# Patient Record
Sex: Male | Born: 1985 | Race: Black or African American | Hispanic: No | Marital: Single | Smoking: Current every day smoker
Health system: Southern US, Community
[De-identification: ages and names within clinical notes are randomized; demographics above are authoritative.]

---
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
## 2009-01-22 ENCOUNTER — Emergency Department: Payer: Self-pay | Admitting: Emergency Medicine

## 2013-08-17 ENCOUNTER — Emergency Department: Payer: Self-pay | Admitting: Emergency Medicine

## 2013-08-18 LAB — CBC
HCT: 45.6 % (ref 40.0–52.0)
HGB: 15.4 g/dL (ref 13.0–18.0)
MCH: 30.6 pg (ref 26.0–34.0)
MCHC: 33.8 g/dL (ref 32.0–36.0)
MCV: 91 fL (ref 80–100)
Platelet: 259 10*3/uL (ref 150–440)
RBC: 5.03 10*6/uL (ref 4.40–5.90)
RDW: 12.8 % (ref 11.5–14.5)
WBC: 8.2 10*3/uL (ref 3.8–10.6)

## 2013-08-18 LAB — COMPREHENSIVE METABOLIC PANEL
ALT: 25 U/L (ref 12–78)
AST: 16 U/L (ref 15–37)
Albumin: 4.4 g/dL (ref 3.4–5.0)
Alkaline Phosphatase: 77 U/L
Anion Gap: 5 — ABNORMAL LOW (ref 7–16)
BUN: 16 mg/dL (ref 7–18)
Bilirubin,Total: 0.4 mg/dL (ref 0.2–1.0)
CALCIUM: 9.6 mg/dL (ref 8.5–10.1)
CHLORIDE: 104 mmol/L (ref 98–107)
Co2: 30 mmol/L (ref 21–32)
Creatinine: 1.31 mg/dL — ABNORMAL HIGH (ref 0.60–1.30)
EGFR (African American): >60
EGFR (Non-African Amer.): >60
Glucose: 101 mg/dL — ABNORMAL HIGH (ref 65–99)
OSMOLALITY: 279 (ref 275–301)
Potassium: 4.3 mmol/L (ref 3.5–5.1)
Sodium: 139 mmol/L (ref 136–145)
Total Protein: 8.8 g/dL — ABNORMAL HIGH (ref 6.4–8.2)

## 2013-08-18 LAB — URINALYSIS, COMPLETE
BACTERIA: NONE SEEN
BILIRUBIN, UR: NEGATIVE
GLUCOSE, UR: NEGATIVE mg/dL (ref 0–75)
Ketone: NEGATIVE
LEUKOCYTE ESTERASE: NEGATIVE
NITRITE: NEGATIVE
PH: 6 (ref 4.5–8.0)
RBC,UR: 8007 /HPF (ref 0–5)
Specific Gravity: 1.027 (ref 1.003–1.030)
Squamous Epithelial: NONE SEEN

## 2014-07-29 ENCOUNTER — Emergency Department: Payer: Self-pay | Admitting: Emergency Medicine

## 2014-07-29 LAB — URINALYSIS, COMPLETE
BILIRUBIN, UR: NEGATIVE
Bacteria: NONE SEEN
GLUCOSE, UR: NEGATIVE mg/dL (ref 0–75)
Ketone: NEGATIVE
Leukocyte Esterase: NEGATIVE
Nitrite: NEGATIVE
PH: 6 (ref 4.5–8.0)
RBC,UR: 2884 /HPF (ref 0–5)
SQUAMOUS EPITHELIAL: NONE SEEN
Specific Gravity: 1.027 (ref 1.003–1.030)
WBC UR: 3 /HPF (ref 0–5)

## 2014-07-30 LAB — CBC
HCT: 40.9 % (ref 40.0–52.0)
HGB: 13.8 g/dL (ref 13.0–18.0)
MCH: 30.8 pg (ref 26.0–34.0)
MCHC: 33.8 g/dL (ref 32.0–36.0)
MCV: 91 fL (ref 80–100)
Platelet: 203 10*3/uL (ref 150–440)
RBC: 4.48 10*6/uL (ref 4.40–5.90)
RDW: 12.8 % (ref 11.5–14.5)
WBC: 7.8 10*3/uL (ref 3.8–10.6)

## 2014-07-30 LAB — BASIC METABOLIC PANEL
Anion Gap: 4 — ABNORMAL LOW (ref 7–16)
BUN: 18 mg/dL (ref 7–18)
CREATININE: 1.29 mg/dL (ref 0.60–1.30)
Calcium, Total: 8.6 mg/dL (ref 8.5–10.1)
Chloride: 105 mmol/L (ref 98–107)
Co2: 30 mmol/L (ref 21–32)
EGFR (African American): >60
EGFR (Non-African Amer.): >60
Glucose: 100 mg/dL — ABNORMAL HIGH (ref 65–99)
Osmolality: 280 (ref 275–301)
POTASSIUM: 3.5 mmol/L (ref 3.5–5.1)
SODIUM: 139 mmol/L (ref 136–145)

## 2014-09-05 ENCOUNTER — Emergency Department: Payer: Self-pay | Admitting: Emergency Medicine

## 2014-09-05 LAB — URINALYSIS, COMPLETE
BACTERIA: NONE SEEN
BILIRUBIN, UR: NEGATIVE
GLUCOSE, UR: NEGATIVE mg/dL (ref 0–75)
KETONE: NEGATIVE
Leukocyte Esterase: NEGATIVE
NITRITE: NEGATIVE
Ph: 6 (ref 4.5–8.0)
Protein: NEGATIVE
RBC,UR: 65 /HPF (ref 0–5)
SPECIFIC GRAVITY: 1.024 (ref 1.003–1.030)
WBC UR: 2 /HPF (ref 0–5)

## 2014-09-05 LAB — COMPREHENSIVE METABOLIC PANEL
ALK PHOS: 61 U/L (ref 46–116)
Albumin: 3.9 g/dL (ref 3.4–5.0)
Anion Gap: 7 (ref 7–16)
BILIRUBIN TOTAL: 0.5 mg/dL (ref 0.2–1.0)
BUN: 12 mg/dL (ref 7–18)
CO2: 29 mmol/L (ref 21–32)
CREATININE: 1.27 mg/dL (ref 0.60–1.30)
Calcium, Total: 8.9 mg/dL (ref 8.5–10.1)
Chloride: 104 mmol/L (ref 98–107)
EGFR (African American): >60
EGFR (Non-African Amer.): >60
Glucose: 81 mg/dL (ref 65–99)
Osmolality: 278 (ref 275–301)
Potassium: 4 mmol/L (ref 3.5–5.1)
SGOT(AST): 21 U/L (ref 15–37)
SGPT (ALT): 20 U/L (ref 14–63)
Sodium: 140 mmol/L (ref 136–145)
TOTAL PROTEIN: 7.5 g/dL (ref 6.4–8.2)

## 2014-09-05 LAB — CBC WITH DIFFERENTIAL/PLATELET
BASOS ABS: 1 %
COMMENT - H1-COM1: NORMAL
COMMENT - H1-COM2: NORMAL
EOS PCT: 2 %
HCT: 42.3 % (ref 40.0–52.0)
HGB: 14.2 g/dL (ref 13.0–18.0)
Lymphocytes: 62 %
MCH: 30.8 pg (ref 26.0–34.0)
MCHC: 33.6 g/dL (ref 32.0–36.0)
MCV: 92 fL (ref 80–100)
MONOS PCT: 4 %
PLATELETS: 227 10*3/uL (ref 150–440)
RBC: 4.61 10*6/uL (ref 4.40–5.90)
RDW: 13.6 % (ref 11.5–14.5)
Segmented Neutrophils: 29 %
VARIANT LYMPHOCYTE - H1-RLYMPH: 2 %
WBC: 5.2 10*3/uL (ref 3.8–10.6)

## 2015-04-15 ENCOUNTER — Encounter: Payer: Self-pay | Admitting: *Deleted

## 2015-04-15 ENCOUNTER — Emergency Department
Admission: EM | Admit: 2015-04-15 | Discharge: 2015-04-15 | Disposition: A | Discharge status: Home / Self Care | Payer: Self-pay | Attending: Emergency Medicine | Admitting: Emergency Medicine

## 2015-04-15 DIAGNOSIS — J020 Streptococcal pharyngitis: Secondary | ICD-10-CM | POA: Insufficient documentation

## 2015-04-15 MED ORDER — PENICILLIN G BENZATHINE 1200000 UNIT/2ML IM SUSP
2.4000 10*6.[IU] | Freq: Once | INTRAMUSCULAR | Status: AC
  Administered 2015-04-15: 2.4 10*6.[IU] via INTRAMUSCULAR
  Filled 2015-04-15: qty 4

## 2015-04-15 MED ORDER — PREDNISONE 20 MG PO TABS
60.0000 mg | ORAL_TABLET | Freq: Once | ORAL | Status: AC
  Administered 2015-04-15: 60 mg via ORAL
  Filled 2015-04-15: qty 3

## 2015-04-15 NOTE — ED Provider Notes (Signed)
[facility] Emergency Department Provider Note  Time seen: 11:53 AM  I have reviewed the triage vital signs and the nursing notes.   HISTORY  Chief Complaint No chief complaint on file.    HPI Erik Patel is a 29 y.o. male with no past medical history who presents the emergency department with a sore throat 5 days. According to the patient for the past 5 days he has had a progressively worsening sore throat. He states intermittent fevers at home, subjective. Denies nausea or vomiting. Describes the throat pain is moderate, worse with swallowing.   No past medical history on file.  There are no active problems to display for this patient.   No past surgical history on file.  No current outpatient prescriptions on file.  Allergies Review of patient's allergies indicates not on file.  No family history on file.  Social History Social History  Substance Use Topics  . Smoking status: Not on file  . Smokeless tobacco: Not on file  . Alcohol Use: Not on file    Review of Systems Constitutional: Positive subjective fevers at home. ENT: Positive for sore throat. Cardiovascular: Negative for chest pain. Respiratory: Negative for shortness of breath. Gastrointestinal: Negative for abdominal pain Skin: Negative for rash. Neurological: Negative for headache 10-point ROS otherwise negative.  ____________________________________________   PHYSICAL EXAM:  Constitutional: Alert and oriented. Well appearing and in no distress. Eyes: Normal exam ENT   Head: Normocephalic and atraumatic.   Nose: No congestion/rhinnorhea.   Mouth/Throat: Mucous membranes are moist. Moderate pharyngeal erythema with bilateral tonsillar swelling, erythema with exudates. Uvula is midline. Patient does have significant bilateral anterior cervical lymphadenopathy. Patient's floor of the mouth is soft, and nontender. No tenderness to tracheal  rocking. Cardiovascular: Normal rate, regular rhythm. No murmur Respiratory: Normal respiratory effort without tachypnea nor retractions. Breath sounds are clear and equal bilaterally. No wheezes/rales/rhonchi. Gastrointestinal: Soft and nontender. No distention.  Musculoskeletal: Nontender with normal range of motion in all extremities Neurologic:  Normal speech and language. No gross focal neurologic deficits Skin:  Skin is warm, dry and intact.  Psychiatric: Mood and affect are normal. Speech and behavior are normal.   ____________________________________________   INITIAL IMPRESSION / ASSESSMENT AND PLAN / ED COURSE  Pertinent labs & imaging results that were available during my care of the patient were reviewed by me and considered in my medical decision making (see chart for details).  Patient with tonsillar exudates, subjective fever, cervical lymphadenopathy, and lack of cough. We will treat with Bicillin in the emergency department. We will also dose Decadron for symptom relief. I discussed with the patient very close monitoring of his condition, and to return if swelling does not go down, or for any increased swelling of one tonsil, as well as trouble swallowing or breathing. Patient agreeable to plan. At this time no sign of peritonsillar abscess, or retropharyngeal infection..  ____________________________________________   FINAL CLINICAL IMPRESSION(S) / ED DIAGNOSES  Streptococcal pharyngitis   Minna Antis, MD 04/15/15 1157

## 2015-04-15 NOTE — ED Notes (Signed)
Pt here with c/o sore throat x3 days.  

## 2015-04-15 NOTE — Discharge Instructions (Signed)

## 2015-04-18 ENCOUNTER — Emergency Department
Admission: EM | Admit: 2015-04-18 | Discharge: 2015-04-18 | Disposition: A | Discharge status: Home / Self Care | Payer: Self-pay | Attending: Emergency Medicine | Admitting: Emergency Medicine

## 2015-04-18 ENCOUNTER — Encounter: Payer: Self-pay | Admitting: Emergency Medicine

## 2015-04-18 DIAGNOSIS — J029 Acute pharyngitis, unspecified: Secondary | ICD-10-CM | POA: Insufficient documentation

## 2015-04-18 DIAGNOSIS — Z720 Tobacco use: Secondary | ICD-10-CM | POA: Insufficient documentation

## 2015-04-18 DIAGNOSIS — B9789 Other viral agents as the cause of diseases classified elsewhere: Secondary | ICD-10-CM

## 2015-04-18 DIAGNOSIS — J028 Acute pharyngitis due to other specified organisms: Secondary | ICD-10-CM

## 2015-04-18 DIAGNOSIS — B2790 Infectious mononucleosis, unspecified without complication: Secondary | ICD-10-CM | POA: Insufficient documentation

## 2015-04-18 LAB — CBC WITH DIFFERENTIAL/PLATELET
BASOS ABS: 0.2 10*3/uL — AB (ref 0–0.1)
Basophils Relative: 1 %
Eosinophils Absolute: 0 10*3/uL (ref 0–0.7)
Eosinophils Relative: 0 %
HEMATOCRIT: 41.2 % (ref 40.0–52.0)
HEMOGLOBIN: 14 g/dL (ref 13.0–18.0)
LYMPHS PCT: 61 %
Lymphs Abs: 10 10*3/uL — ABNORMAL HIGH (ref 1.0–3.6)
MCH: 30.8 pg (ref 26.0–34.0)
MCHC: 34 g/dL (ref 32.0–36.0)
MCV: 90.8 fL (ref 80.0–100.0)
MONOS PCT: 12 %
Monocytes Absolute: 2 10*3/uL — ABNORMAL HIGH (ref 0.2–1.0)
NEUTROS PCT: 26 %
Neutro Abs: 4.3 10*3/uL (ref 1.4–6.5)
Platelets: 185 10*3/uL (ref 150–440)
RBC: 4.54 MIL/uL (ref 4.40–5.90)
RDW: 13 % (ref 11.5–14.5)
WBC: 16.5 10*3/uL — AB (ref 3.8–10.6)

## 2015-04-18 LAB — MONONUCLEOSIS SCREEN: MONO SCREEN: POSITIVE — AB

## 2015-04-18 LAB — PATHOLOGIST SMEAR REVIEW

## 2015-04-18 MED ORDER — METHYLPREDNISOLONE 4 MG PO TBPK: ORAL_TABLET | ORAL | Status: DC

## 2015-04-18 MED ORDER — LIDOCAINE VISCOUS 2 % MT SOLN
15.0000 mL | Freq: Once | OROMUCOSAL | Status: AC
  Administered 2015-04-18: 15 mL via OROMUCOSAL
  Filled 2015-04-18: qty 15

## 2015-04-18 MED ORDER — DEXAMETHASONE SODIUM PHOSPHATE 10 MG/ML IJ SOLN
10.0000 mg | Freq: Once | INTRAMUSCULAR | Status: AC
  Administered 2015-04-18: 10 mg via INTRAMUSCULAR
  Filled 2015-04-18: qty 1

## 2015-04-18 MED ORDER — TRAMADOL HCL 50 MG PO TABS: 50.0000 mg | ORAL_TABLET | Freq: Two times a day (BID) | ORAL | Status: DC

## 2015-04-18 NOTE — ED Provider Notes (Signed)
[facility] Emergency Department Provider Note ____________________________________________  Time seen: 1155  I have reviewed the triage vital signs and the nursing notes.  HISTORY  Chief Complaint  Sore Throat  HPI Erik Patel is a 29 y.o. male who returns to the ED following a. Treatment for presumed strep tonsillitis 3 days prior. He was treated with Bicillin injection and 60 mg of prednisone. He returns today for continued symptoms including fever, and sore throat. He's been dosing Tylenol intermittently, but reports continued pain and fullness in throat. He also notes a lymph node under his chin.  History reviewed. No pertinent past medical history.  There are no active problems to display for this patient.  History reviewed. No pertinent past surgical history.  Current Outpatient Rx  Name  Route  Sig  Dispense  Refill  . methylPREDNISolone (MEDROL DOSEPAK) 4 MG TBPK tablet      Taper pack as directed.   21 tablet   0   . traMADol (ULTRAM) 50 MG tablet   Oral   Take 1 tablet (50 mg total) by mouth 2 (two) times daily.   10 tablet   0    Allergies Review of patient's allergies indicates no known allergies.  History reviewed. No pertinent family history.  Social History Social History  Substance Use Topics  . Smoking status: Current Every Day Smoker -- 0.50 packs/day    Types: Cigarettes  . Smokeless tobacco: None  . Alcohol Use: No   Review of Systems  Constitutional: Negative for fever. Eyes: Negative for visual changes. ENT: Reports sore throat. Cardiovascular: Negative for chest pain. Respiratory: Negative for shortness of breath. Gastrointestinal: Negative for abdominal pain, vomiting and diarrhea. Genitourinary: Negative for dysuria. Musculoskeletal: Negative for back pain. Skin: Negative for rash. Neurological: Negative for headaches, focal weakness or numbness. ____________________________________________  PHYSICAL  EXAM:  VITAL SIGNS: ED Triage Vitals  Enc Vitals Group     BP 04/18/15 1014 129/86 mmHg     Pulse Rate 04/18/15 1014 93     Resp 04/18/15 1014 17     Temp 04/18/15 1014 100.4 F (38 C)     Temp Source 04/18/15 1014 Oral     SpO2 04/18/15 1014 98 %     Weight 04/18/15 1014 195 lb (88.451 kg)     Height 04/18/15 1014  (1.905 m)     Head Cir --      Peak Flow --      Pain Score 04/18/15 1436 3     Pain Loc --      Pain Edu? --      Excl. in GC? --    Constitutional: Alert and oriented. Well appearing and in no distress. Eyes: Conjunctivae are normal. PERRL. Normal extraocular movements. ENT   Head: Normocephalic and atraumatic.   Nose: No congestion/rhinorrhea.   Mouth/Throat: Mucous membranes are moist. Uvula midline. Tonsils enlarged without erythema.    Neck: Supple. No thyromegaly. Hematological/Lymphatic/Immunological: No anterior cervical and sublingual lymphadenopathy noted.  Cardiovascular: Normal rate, regular rhythm.  Respiratory: Normal respiratory effort. No wheezes/rales/rhonchi. Gastrointestinal: Soft and nontender. No distention. Musculoskeletal: Nontender with normal range of motion in all extremities.  Neurologic:  Normal gait without ataxia. Normal speech and language. No gross focal neurologic deficits are appreciated. Skin:  Skin is warm, dry and intact. No rash noted. Psychiatric: Mood and affect are normal. Patient exhibits appropriate insight and judgment. ____________________________________________    LABS (pertinent positives/negatives) Labs Reviewed  MONONUCLEOSIS SCREEN - Abnormal; Notable  for the following:    Mono Screen POSITIVE (*)    All other components within normal limits  CBC WITH DIFFERENTIAL/PLATELET - Abnormal; Notable for the following:    WBC 16.5 (*)    Lymphs Abs 10.0 (*)    Monocytes Absolute 2.0 (*)    Basophils Absolute 0.2 (*)    All other components within normal limits  CULTURE, GROUP A STREP ([facility] ONLY)   PATHOLOGIST SMEAR REVIEW  ____________________________________________  PROCEDURES  Decadron 10 mg IM Lidocaine 2% gargle ____________________________________________  INITIAL IMPRESSION / ASSESSMENT AND PLAN / ED COURSE  Treatment for presumed pharyngitis due to mononucleosis. Patient discharged with mental prednisone taper pack as well as Ultram as needed for pain. He is to continue Tylenol for fevers and salt water gargles needed for sore throat pain. He will follow-up with his primary care provider or return to the ED as needed for worsening symptoms. ____________________________________________  FINAL CLINICAL IMPRESSION(S) / ED DIAGNOSES  Final diagnoses:  Mononucleosis  Sore throat (viral)     Lissa Hoard, PA-C 04/18/15 1752  Sharman Cheek, MD 04/19/15 0900

## 2015-04-18 NOTE — ED Notes (Signed)
Patient reports being seen here a few days ago for sore throat. Reports being given Penicillin shots but is not feeling any better.

## 2015-04-18 NOTE — Discharge Instructions (Signed)
Infectious Mononucleosis Infectious mononucleosis (mono) is a common germ (viral) infection in children, teenagers, and young adults.  CAUSES  Mono is an infection caused by the Malachi Carl virus. The virus is spread by close personal contact with someone who has the infection. It can be passed by contact with your saliva through things such as kissing or sharing drinking glasses. Sometimes, the infection can be spread from someone who does not appear sick but still spreads the virus (asymptomatic carrier state).  SYMPTOMS  The most common symptoms of Mono are:  Sore throat.  Headache.  Fatigue.  Muscle aches.  Swollen glands.  Fever.  Poor appetite.  Enlarged liver or spleen. The less common symptoms can include:  Rash.  Feeling sick to your stomach (nauseous).  Abdominal pain. DIAGNOSIS  Mono is diagnosed by a blood test.  TREATMENT  Treatment of mono is usually at home. There is no medicine that cures this virus. Sometimes hospital treatment is needed in severe cases. Steroid medicine sometimes is needed if the swelling in the throat causes breathing or swallowing problems.  HOME CARE INSTRUCTIONS   Drink enough fluids to keep your urine clear or pale yellow.  Eat soft foods. Cool foods like popsicles or ice cream can soothe a sore throat.  Only take over-the-counter or prescription medicines for pain, discomfort, or fever as directed by your caregiver. Children under 98 years of age should not take aspirin.  Gargle salt water. This may help relieve your sore throat. Put 1 teaspoon (tsp) of salt in 1 cup of warm water. Sucking on hard candy may also help.  Rest as needed.  Start regular activities gradually after the fever is gone. Be sure to rest when tired.  Avoid strenuous exercise or contact sports until your caregiver says it is okay. The liver and spleen could be seriously injured.  Avoid sharing drinking glasses or kissing until your caregiver tells you  that you are no longer contagious. SEEK MEDICAL CARE IF:   Your fever is not gone after 7 days.  Your activity level is not back to normal after 2 weeks.  You have yellow coloring to eyes and skin (jaundice). SEEK IMMEDIATE MEDICAL CARE IF:   You have severe pain in the abdomen or shoulder.  You have trouble swallowing or drooling.  You have trouble breathing.  You develop a stiff neck.  You develop a severe headache.  You cannot stop throwing up (vomiting).  You have convulsions.  You are confused.  You have trouble with balance.  You develop signs of body fluid loss (dehydration):  Weakness.  Sunken eyes.  Pale skin.  Dry mouth.  Rapid breathing or pulse. MAKE SURE YOU:   Understand these instructions.  Will watch your condition.  Will get help right away if you are not doing well or get worse. Document Released: 07/24/2000 Document Revised: 10/19/2011 Document Reviewed: 05/22/2008 [facility] Patient Information 2015 [place], [place]. This information is not intended to replace advice given to you by your health care provider. Make sure you discuss any questions you have with your health care provider.  Pharyngitis Pharyngitis is a sore throat (pharynx). There is redness, pain, and swelling of your throat. HOME CARE   Drink enough fluids to keep your pee (urine) clear or pale yellow.  Only take medicine as told by your doctor.  You may get sick again if you do not take medicine as told. Finish your medicines, even if you start to feel better.  Do not take  aspirin.  Rest.  Rinse your mouth (gargle) with salt water ( tsp of salt per 1 qt of water) every 1-2 hours. This will help the pain.  If you are not at risk for choking, you can suck on hard candy or sore throat lozenges. GET HELP IF:  You have large, tender lumps on your neck.  You have a rash.  You cough up green, yellow-brown, or bloody spit. GET HELP RIGHT AWAY IF:   You have a stiff  neck.  You drool or cannot swallow liquids.  You throw up (vomit) or are not able to keep medicine or liquids down.  You have very bad pain that does not go away with medicine.  You have problems breathing (not from a stuffy nose). MAKE SURE YOU:   Understand these instructions.  Will watch your condition.  Will get help right away if you are not doing well or get worse. Document Released: 01/13/2008 Document Revised: 05/17/2013 Document Reviewed: 04/03/2013 [facility] Patient Information 2015 [place], [place]. This information is not intended to replace advice given to you by your health care provider. Make sure you discuss any questions you have with your health care provider.   Take the steroid as directed. Rinse with warm salty water daily.  Take only Tylenol in addition to the steroid and pain medicines. Follow-up with [employer] as needed.

## 2015-04-21 LAB — CULTURE, GROUP A STREP (THRC)

## 2015-04-22 NOTE — Progress Notes (Signed)
Recommended no antibiotic treatment for throat culture growing Group B Strep in patient d/c from ED with mono. Dr. Lacretia Nicks signed off on recommendation.

## 2017-09-03 ENCOUNTER — Encounter: Payer: Self-pay | Admitting: *Deleted

## 2017-09-03 ENCOUNTER — Ambulatory Visit
Admission: EM | Admit: 2017-09-03 | Discharge: 2017-09-03 | Disposition: A | Discharge status: Home / Self Care | Payer: Self-pay | Attending: Family Medicine | Admitting: Family Medicine

## 2017-09-03 ENCOUNTER — Other Ambulatory Visit: Payer: Self-pay

## 2017-09-03 DIAGNOSIS — H6502 Acute serous otitis media, left ear: Secondary | ICD-10-CM | POA: Insufficient documentation

## 2017-09-03 DIAGNOSIS — F17210 Nicotine dependence, cigarettes, uncomplicated: Secondary | ICD-10-CM | POA: Insufficient documentation

## 2017-09-03 DIAGNOSIS — J069 Acute upper respiratory infection, unspecified: Secondary | ICD-10-CM | POA: Insufficient documentation

## 2017-09-03 DIAGNOSIS — J029 Acute pharyngitis, unspecified: Secondary | ICD-10-CM

## 2017-09-03 DIAGNOSIS — B9789 Other viral agents as the cause of diseases classified elsewhere: Secondary | ICD-10-CM

## 2017-09-03 DIAGNOSIS — R05 Cough: Secondary | ICD-10-CM

## 2017-09-03 LAB — RAPID STREP SCREEN (MED CTR MEBANE ONLY): STREPTOCOCCUS, GROUP A SCREEN (DIRECT): NEGATIVE

## 2017-09-03 MED ORDER — AMOXICILLIN 875 MG PO TABS: 875.0000 mg | ORAL_TABLET | Freq: Two times a day (BID) | ORAL | 0 refills | Status: DC

## 2017-09-03 NOTE — ED Triage Notes (Signed)
Patient started having symptoms of sore throat, nasal congestion, and bilateral ear pain 2 days ago.

## 2017-09-03 NOTE — ED Provider Notes (Signed)
[facility]    CSN: [id number] Arrival date & time: 09/03/17  1151     History   Chief Complaint Chief Complaint  Patient presents with  . Sore Throat  . Otalgia    HPI Erik Planerrance J Patel is a 32 y.o. male.   The history is provided by the patient.  Sore Throat   Otalgia  Associated symptoms: congestion, cough, rhinorrhea and sore throat   URI  Presenting symptoms: congestion, cough, ear pain, fatigue, rhinorrhea and sore throat   Severity:  Moderate Onset quality:  Sudden Duration:  3 days Timing:  Constant Progression:  Worsening Chronicity:  New Relieved by:  Nothing Ineffective treatments:  OTC medications Associated symptoms: no sinus pain and no wheezing   Risk factors: sick contacts   Risk factors: not elderly, no chronic cardiac disease, no chronic kidney disease, no chronic respiratory disease, no diabetes mellitus, no immunosuppression, no recent illness and no recent travel     History reviewed. No pertinent past medical history.  There are no active problems to display for this patient.   History reviewed. No pertinent surgical history.     Home Medications    Prior to Admission medications   Medication Sig Start Date End Date Taking? Authorizing Provider  amoxicillin (AMOXIL) 875 MG tablet Take 1 tablet (875 mg total) by mouth 2 (two) times daily. 09/03/17   Payton Mccallumonty, Greco Gastelum, MD  methylPREDNISolone (MEDROL DOSEPAK) 4 MG TBPK tablet Taper pack as directed. 04/18/15   Menshew, Charlesetta IvoryJenise V Bacon, PA-C  traMADol (ULTRAM) 50 MG tablet Take 1 tablet (50 mg total) by mouth 2 (two) times daily. 04/18/15   Menshew, Charlesetta IvoryJenise V Bacon, PA-C    Family History Family History  Problem Relation Age of Onset  . Diabetes Mother     Social History Social History   Tobacco Use  . Smoking status: Current Every Day Smoker    Packs/day: 0.50    Types: Cigarettes  . Smokeless tobacco: Never Used  Substance Use Topics  . Alcohol use: No  . Drug use: No      Allergies   Patient has no known allergies.   Review of Systems Review of Systems  Constitutional: Positive for fatigue.  HENT: Positive for congestion, ear pain, rhinorrhea and sore throat. Negative for sinus pain.   Respiratory: Positive for cough. Negative for wheezing.      Physical Exam Triage Vital Signs ED Triage Vitals  Enc Vitals Group     BP 09/03/17 1202 113/74     Pulse Rate 09/03/17 1202 71     Resp 09/03/17 1202 16     Temp 09/03/17 1202 97.7 F (36.5 C)     Temp Source 09/03/17 1202 Oral     SpO2 09/03/17 1202 100 %     Weight 09/03/17 1204 210 lb (95.3 kg)     Height 09/03/17 1204 6\' 3"  (1.905 m)     Head Circumference --      Peak Flow --      Pain Score 09/03/17 1203 8     Pain Loc --      Pain Edu? --      Excl. in GC? --    No data found.  Updated Vital Signs BP 113/74 (BP Location: Left Arm)   Pulse 71   Temp 97.7 F (36.5 C) (Oral)   Resp 16   Ht 6\' 3"  (1.905 m)   Wt 210 lb (95.3 kg)   SpO2 100%   BMI 26.25  kg/m   Visual Acuity Right Eye Distance:   Left Eye Distance:   Bilateral Distance:    Right Eye Near:   Left Eye Near:    Bilateral Near:     Physical Exam  Constitutional: He appears well-developed and well-nourished. No distress.  HENT:  Head: Normocephalic and atraumatic.  Right Ear: Tympanic membrane, external ear and ear canal normal.  Left Ear: External ear and ear canal normal. Tympanic membrane is erythematous and bulging. A middle ear effusion is present.  Nose: Nose normal.  Mouth/Throat: Uvula is midline and mucous membranes are normal. Posterior oropharyngeal erythema present. No oropharyngeal exudate, posterior oropharyngeal edema or tonsillar abscesses. No tonsillar exudate.  Eyes: Conjunctivae and EOM are normal. Pupils are equal, round, and reactive to light. Right eye exhibits no discharge. Left eye exhibits no discharge. No scleral icterus.  Neck: Normal range of motion. Neck supple. No tracheal  deviation present. No thyromegaly present.  Cardiovascular: Normal rate, regular rhythm and normal heart sounds.  Pulmonary/Chest: Effort normal and breath sounds normal. No stridor. No respiratory distress. He has no wheezes. He has no rales. He exhibits no tenderness.  Lymphadenopathy:    He has no cervical adenopathy.  Neurological: He is alert.  Skin: Skin is warm and dry. No rash noted. He is not diaphoretic.  Nursing note and vitals reviewed.    UC Treatments / Results  Labs (all labs ordered are listed, but only abnormal results are displayed) Labs Reviewed  RAPID STREP SCREEN (NOT AT [facility])  CULTURE, GROUP A STREP [facility])    EKG  EKG Interpretation None       Radiology No results found.  Procedures Procedures (including critical care time)  Medications Ordered in UC Medications - No data to display   Initial Impression / Assessment and Plan / UC Course  I have reviewed the triage vital signs and the nursing notes.  Pertinent labs & imaging results that were available during my care of the patient were reviewed by me and considered in my medical decision making (see chart for details).       Final Clinical Impressions(s) / UC Diagnoses   Final diagnoses:  Viral URI  Acute serous otitis media of left ear, recurrence not specified    ED Discharge Orders        Ordered    amoxicillin (AMOXIL) 875 MG tablet  2 times daily     09/03/17 1250     1. Lab results and diagnosis reviewed with patient 2. rx as per orders above; reviewed possible side effects, interactions, risks and benefits  3. Recommend supportive treatment with  4. Follow-up prn if symptoms worsen or don't improve  Controlled Substance Prescriptions [place] Controlled Substance Registry consulted? Not Applicable   Payton Mccallum, MD 09/03/17 1258

## 2017-09-06 LAB — CULTURE, GROUP A STREP (THRC)

## 2018-09-03 ENCOUNTER — Encounter: Payer: Self-pay | Admitting: Emergency Medicine

## 2018-09-03 ENCOUNTER — Emergency Department
Admission: EM | Admit: 2018-09-03 | Discharge: 2018-09-03 | Disposition: A | Discharge status: Home / Self Care | Payer: Managed Care, Other (non HMO) | Attending: Emergency Medicine | Admitting: Emergency Medicine

## 2018-09-03 ENCOUNTER — Other Ambulatory Visit: Payer: Self-pay

## 2018-09-03 DIAGNOSIS — Z79899 Other long term (current) drug therapy: Secondary | ICD-10-CM | POA: Diagnosis not present

## 2018-09-03 DIAGNOSIS — F17210 Nicotine dependence, cigarettes, uncomplicated: Secondary | ICD-10-CM | POA: Diagnosis not present

## 2018-09-03 DIAGNOSIS — B009 Herpesviral infection, unspecified: Secondary | ICD-10-CM | POA: Insufficient documentation

## 2018-09-03 DIAGNOSIS — N4889 Other specified disorders of penis: Secondary | ICD-10-CM | POA: Diagnosis present

## 2018-09-03 MED ORDER — NEOMYCIN-POLYMYXIN-PRAMOXINE 1 % EX CREA: TOPICAL_CREAM | Freq: Two times a day (BID) | CUTANEOUS | 0 refills | Status: DC

## 2018-09-03 MED ORDER — ACYCLOVIR 400 MG PO TABS: 400.0000 mg | ORAL_TABLET | Freq: Every day | ORAL | 0 refills | Status: DC

## 2018-09-03 NOTE — Discharge Instructions (Signed)
[place]pson salt soaks as directed.  Have sexual partner evaluated.

## 2018-09-03 NOTE — ED Notes (Signed)
Pt given follow up instructions at [facility] Department for further STD testing and for partner to be tested. Informed pt that these are free services that he can take advantage of. Pt verbalized understanding.

## 2018-09-03 NOTE — ED Provider Notes (Signed)
[facility] Emergency Department Provider Note   ____________________________________________   First MD Initiated Contact with Patient 09/03/18 1206     (approximate)  I have reviewed the triage vital signs and the nursing notes.   HISTORY  Chief Complaint Blister    HPI Fuller Planerrance J Argo is a 33 y.o. male patient presents with blisters on his penis for 1 week.  Patient denies urethral discharge.   Patient state burning itching sensation with lesions.  Patient denies inguinal swelling or fever.  Patient stated in a monogamous relationship.  No palliative measure for complaint.   History reviewed. No pertinent past medical history.  There are no active problems to display for this patient.   History reviewed. No pertinent surgical history.  Prior to Admission medications   Medication Sig Start Date End Date Taking? Authorizing Provider  acyclovir (ZOVIRAX) 400 MG tablet Take 1 tablet (400 mg total) by mouth 5 (five) times daily. 09/03/18   Joni ReiningSmith, Greysen Devino K, PA-C  amoxicillin (AMOXIL) 875 MG tablet Take 1 tablet (875 mg total) by mouth 2 (two) times daily. 09/03/17   Payton Mccallumonty, [place], MD  methylPREDNISolone (MEDROL DOSEPAK) 4 MG TBPK tablet Taper pack as directed. 04/18/15   Menshew, Charlesetta IvoryJenise V Bacon, PA-C  neomycin-polymyxin-pramoxine (NEOSPORIN PLUS) 1 % cream Apply topically 2 (two) times daily. 09/03/18   Joni ReiningSmith, Evellyn Tuff K, PA-C  traMADol (ULTRAM) 50 MG tablet Take 1 tablet (50 mg total) by mouth 2 (two) times daily. 04/18/15   Menshew, Charlesetta IvoryJenise V Bacon, PA-C    Allergies Patient has no known allergies.  Family History  Problem Relation Age of Onset  . Diabetes Mother     Social History Social History   Tobacco Use  . Smoking status: Current Every Day Smoker    Packs/day: 0.50    Types: Cigarettes  . Smokeless tobacco: Never Used  Substance Use Topics  . Alcohol use: No  . Drug use: No    Review of Systems Constitutional: No fever/chills Eyes:  No visual changes. ENT: No sore throat. Cardiovascular: Denies chest pain. Respiratory: Denies shortness of breath. Gastrointestinal: No abdominal pain.  No nausea, no vomiting.  No diarrhea.  No constipation. Genitourinary: Negative for dysuria. Musculoskeletal: Negative for back pain. Skin: Positive for rash. Neurological: Negative for headaches, focal weakness or numbness.   ____________________________________________   PHYSICAL EXAM:  VITAL SIGNS: ED Triage Vitals  Enc Vitals Group     BP 09/03/18 1204 (!) 124/52     Pulse Rate 09/03/18 1204 65     Resp 09/03/18 1204 18     Temp 09/03/18 1204 97.8 F (36.6 C)     Temp Source 09/03/18 1204 Oral     SpO2 09/03/18 1204 100 %     Weight 09/03/18 1205 200 lb (90.7 kg)     Height 09/03/18 1205 6\' 3"  (1.905 m)     Head Circumference --      Peak Flow --      Pain Score 09/03/18 1205 8     Pain Loc --      Pain Edu? --      Excl. in [place]? --     Constitutional: Alert and oriented. Well appearing and in no acute distress. Cardiovascular: Normal rate, regular rhythm. Grossly normal heart sounds.  Good peripheral circulation. Respiratory: Normal respiratory effort.  No retractions. Lungs CTAB. Genitourinary: No urethral discharge. Musculoskeletal: No lower extremity tenderness nor edema.  No joint effusions. Neurologic:  Normal speech and language. No gross focal  neurologic deficits are appreciated. No gait instability. Skin:  Skin is warm, dry and intact. No rash noted. Psychiatric: Mood and affect are normal. Speech and behavior are normal.  ____________________________________________   LABS (all labs ordered are listed, but only abnormal results are displayed)  Labs Reviewed - No data to display ____________________________________________  EKG   ____________________________________________  RADIOLOGY  ED MD interpretation:    Official radiology report(s): No results  found.  ____________________________________________   PROCEDURES  Procedure(s) performed: None  Procedures  Critical Care performed: No  ____________________________________________   INITIAL IMPRESSION / ASSESSMENT AND PLAN / ED COURSE  As part of my medical decision making, I reviewed the following data within the electronic MEDICAL RECORD NUMBER     Patient presents with vascular lesions on shaft of penis consistent with herpes simplex 2.  Patient given discharge care instruction advised follow-up with health department for further evaluation and treatment as needed.  Patient given a prescription for acyclovir.      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    Joni Reining, PA-C 09/03/18 1220    Jene Every, MD 09/03/18 [phone]

## 2018-09-03 NOTE — ED Triage Notes (Signed)
States blister penis x 1 week. Denies discharge.

## 2019-05-24 ENCOUNTER — Other Ambulatory Visit: Payer: Self-pay

## 2019-05-24 DIAGNOSIS — Z20822 Contact with and (suspected) exposure to covid-19: Secondary | ICD-10-CM

## 2019-05-25 LAB — NOVEL CORONAVIRUS, NAA: SARS-CoV-2, NAA: NOT DETECTED

## 2019-09-04 ENCOUNTER — Emergency Department: Payer: Self-pay

## 2019-09-04 ENCOUNTER — Emergency Department
Admission: EM | Admit: 2019-09-04 | Discharge: 2019-09-04 | Disposition: A | Discharge status: Home / Self Care | Payer: Self-pay | Attending: Emergency Medicine | Admitting: Emergency Medicine

## 2019-09-04 ENCOUNTER — Encounter: Payer: Self-pay | Admitting: Emergency Medicine

## 2019-09-04 ENCOUNTER — Other Ambulatory Visit: Payer: Self-pay

## 2019-09-04 DIAGNOSIS — K529 Noninfective gastroenteritis and colitis, unspecified: Secondary | ICD-10-CM | POA: Insufficient documentation

## 2019-09-04 DIAGNOSIS — R112 Nausea with vomiting, unspecified: Secondary | ICD-10-CM | POA: Insufficient documentation

## 2019-09-04 DIAGNOSIS — F17210 Nicotine dependence, cigarettes, uncomplicated: Secondary | ICD-10-CM | POA: Insufficient documentation

## 2019-09-04 DIAGNOSIS — Z20822 Contact with and (suspected) exposure to covid-19: Secondary | ICD-10-CM | POA: Insufficient documentation

## 2019-09-04 LAB — CBC
HCT: 42.9 % (ref 39.0–52.0)
Hemoglobin: 14.8 g/dL (ref 13.0–17.0)
MCH: 31.2 pg (ref 26.0–34.0)
MCHC: 34.5 g/dL (ref 30.0–36.0)
MCV: 90.5 fL (ref 80.0–100.0)
Platelets: 205 10*3/uL (ref 150–400)
RBC: 4.74 MIL/uL (ref 4.22–5.81)
RDW: 12.4 % (ref 11.5–15.5)
WBC: 7.6 10*3/uL (ref 4.0–10.5)
nRBC: 0 % (ref 0.0–0.2)

## 2019-09-04 LAB — URINALYSIS, COMPLETE (UACMP) WITH MICROSCOPIC
Bilirubin Urine: NEGATIVE
Glucose, UA: NEGATIVE mg/dL
Hgb urine dipstick: NEGATIVE
Ketones, ur: 20 mg/dL — AB
Nitrite: NEGATIVE
Protein, ur: 30 mg/dL — AB
Specific Gravity, Urine: 1.026 (ref 1.005–1.030)
pH: 7 (ref 5.0–8.0)

## 2019-09-04 LAB — COMPREHENSIVE METABOLIC PANEL
ALT: 16 U/L (ref 0–44)
AST: 23 U/L (ref 15–41)
Albumin: 4.5 g/dL (ref 3.5–5.0)
Alkaline Phosphatase: 58 U/L (ref 38–126)
Anion gap: 7 (ref 5–15)
BUN: 14 mg/dL (ref 6–20)
CO2: 28 mmol/L (ref 22–32)
Calcium: 9.4 mg/dL (ref 8.9–10.3)
Chloride: 104 mmol/L (ref 98–111)
Creatinine, Ser: 1.18 mg/dL (ref 0.61–1.24)
GFR calc Af Amer: >60 mL/min (ref >60)
GFR calc non Af Amer: >60 mL/min (ref >60)
Glucose, Bld: 106 mg/dL — ABNORMAL HIGH (ref 70–99)
Potassium: 4.1 mmol/L (ref 3.5–5.1)
Sodium: 139 mmol/L (ref 135–145)
Total Bilirubin: 0.6 mg/dL (ref 0.3–1.2)
Total Protein: 7.7 g/dL (ref 6.5–8.1)

## 2019-09-04 LAB — LIPASE, BLOOD: Lipase: 25 U/L (ref 11–51)

## 2019-09-04 LAB — SARS CORONAVIRUS 2 (TAT 6-24 HRS): SARS Coronavirus 2: NEGATIVE

## 2019-09-04 MED ORDER — SODIUM CHLORIDE 0.9% FLUSH: 3.0000 mL | Freq: Once | INTRAVENOUS | Status: DC

## 2019-09-04 MED ORDER — IOHEXOL 300 MG/ML  SOLN
100.0000 mL | Freq: Once | INTRAMUSCULAR | Status: AC | PRN
  Administered 2019-09-04: 100 mL via INTRAVENOUS
  Filled 2019-09-04: qty 100

## 2019-09-04 MED ORDER — SODIUM CHLORIDE 0.9 % IV BOLUS
1000.0000 mL | Freq: Once | INTRAVENOUS | Status: AC
  Administered 2019-09-04: 1000 mL via INTRAVENOUS

## 2019-09-04 MED ORDER — MORPHINE SULFATE (PF) 4 MG/ML IV SOLN
4.0000 mg | Freq: Once | INTRAVENOUS | Status: AC
  Administered 2019-09-04: 4 mg via INTRAVENOUS
  Filled 2019-09-04: qty 1

## 2019-09-04 MED ORDER — ONDANSETRON HCL 4 MG/2ML IJ SOLN
4.0000 mg | Freq: Once | INTRAMUSCULAR | Status: AC
  Administered 2019-09-04: 11:00:00 4 mg via INTRAVENOUS
  Filled 2019-09-04: qty 2

## 2019-09-04 NOTE — ED Notes (Signed)
See triage note  Presents with mid abd pain states pain started this am    Also has had some urinary freq and pain  Afebrile on arrival

## 2019-09-04 NOTE — ED Triage Notes (Signed)
Pt presents to ED via POV with c/o lower abdominal pain that started last night. Pt c/o burning with urination, difficulty urinating, and chills that started last night. Pt ambulatory without difficulty in the lobby at this time, A&O x 4.

## 2019-09-04 NOTE — ED Provider Notes (Signed)
[facility] Emergency Department Provider Note  ____________________________________________   First MD Initiated Contact with Patient 09/04/19 1018     (approximate)  I have reviewed the triage vital signs and the nursing notes.   HISTORY  Chief Complaint Abdominal Pain    HPI NORRIS BRUMBACH is a 34 y.o. male presents emergency department complaining of abdominal pain with bloody diarrhea.  States he has burning when he poops.  No difficulty with urination.  States he is not burning with urination.  No fever or chills that he knows of.  No known exposure to Covid.   Some nausea and one episode of vomiting.  Multiple episodes of diarrhea.  He states some afraid if something I ate.   History reviewed. No pertinent past medical history.  There are no problems to display for this patient.   History reviewed. No pertinent surgical history.  Prior to Admission medications   Not on File    Allergies Patient has no known allergies.  Family History  Problem Relation Age of Onset  . Diabetes Mother     Social History Social History   Tobacco Use  . Smoking status: Current Every Day Smoker    Packs/day: 0.50    Types: Cigarettes  . Smokeless tobacco: Never Used  Substance Use Topics  . Alcohol use: No  . Drug use: No    Review of Systems  Constitutional: No fever/chills Eyes: No visual changes. ENT: No sore throat. Respiratory: Denies cough Cardiovascular: Denies chest pain Gastrointestinal: Positive abdominal pain Genitourinary: Negative for dysuria. Musculoskeletal: Negative for back pain. Skin: Negative for rash. Psychiatric: no mood changes,     ____________________________________________   PHYSICAL EXAM:  VITAL SIGNS: ED Triage Vitals  Enc Vitals Group     BP 09/04/19 0922 134/66     Pulse Rate 09/04/19 0922 66     Resp 09/04/19 0922 18     Temp 09/04/19 0922 97.9 F (36.6 C)     Temp Source 09/04/19 0922 Oral       SpO2 09/04/19 0922 96 %     Weight 09/04/19 0918 200 lb (90.7 kg)     Height 09/04/19 0918 6\' 3"  (1.905 m)     Head Circumference --      Peak Flow --      Pain Score 09/04/19 0918 10     Pain Loc --      Pain Edu? --      Excl. in [place]? --     Constitutional: Alert and oriented. Well appearing and in no acute distress. Eyes: Conjunctivae are normal.  Head: Atraumatic. Nose: No congestion/rhinnorhea. Mouth/Throat: Mucous membranes are moist.   Neck:  supple no lymphadenopathy noted Cardiovascular: Normal rate, regular rhythm. Heart sounds are normal Respiratory: Normal respiratory effort.  No retractions, lungs c t a  Abd: soft tender in the left lower quadrant, bs normal all 4 quad GU: deferred Musculoskeletal: FROM all extremities, warm and well perfused Neurologic:  Normal speech and language.  Skin:  Skin is warm, dry and intact. No rash noted. Psychiatric: Mood and affect are normal. Speech and behavior are normal.  ____________________________________________   LABS (all labs ordered are listed, but only abnormal results are displayed)  Labs Reviewed  COMPREHENSIVE METABOLIC PANEL - Abnormal; Notable for the following components:      Result Value   Glucose, Bld 106 (*)    All other components within normal limits  URINALYSIS, COMPLETE (UACMP) WITH MICROSCOPIC - Abnormal; Notable  for the following components:   Color, Urine YELLOW (*)    APPearance CLOUDY (*)    Ketones, ur 20 (*)    Protein, ur 30 (*)    Leukocytes,Ua TRACE (*)    Bacteria, UA RARE (*)    All other components within normal limits  SARS CORONAVIRUS 2 (TAT 6-24 HRS)  LIPASE, BLOOD  CBC   ____________________________________________   ____________________________________________  RADIOLOGY  CT abdomen/pelvis with IV contrast is negative, normal appendix  ____________________________________________   PROCEDURES  Procedure(s) performed: Saline lock, normal saline 1 L IV, morphine 4  mg IV, Zofran 4 mg IV   Procedures    ____________________________________________   INITIAL IMPRESSION / ASSESSMENT AND PLAN / ED COURSE  Pertinent labs & imaging results that were available during my care of the patient were reviewed by me and considered in my medical decision making (see chart for details).   Patient is 34 year old male presents emergency department with abdominal pain and diarrhea.  Physical exam shows patient to appear stable.  Vitals normal.  Left lower quadrant is tender to palpation.  DDx: Gastroenteritis, diverticulitis, Covid, rectal bleeding  Urinalysis has 20 ketones, 30 protein and trace of leuks, WBCs are 21-50, comprehensive metabolic panel with glucose 106, lipase is normal and CBC is normal  CT abdomen/pelvis with IV contrast  Patient was given saline lock, normal saline 1 L IV, Zofran 4 mg IV, morphine 4 mg.   CT abdomen/pelvis IV contrast is negative.  Explained findings to patient.  Tell him that we have seen some Covid-like symptoms with the GI problems.  We will do a Covid test prior to discharge.  Explained to him he should stay home quarantine until Covid test have been resulted.  He may return to work if the Covid test is negative, his symptoms have improved, he has not had fever in 24 to 48 hours.  However if he is positive he should quarantine for additional 10 days.  Drink plenty fluids.  Take over-the-counter Imodium A-D.  Return as needed.  Follow-up with GI if he continues to have any rectal bleeding.  He was discharged in stable condition   ARWIN BISCEGLIA was evaluated in Emergency Department on 09/04/2019 for the symptoms described in the history of present illness. He was evaluated in the context of the global COVID-19 pandemic, which necessitated consideration that the patient might be at risk for infection with the SARS-CoV-2 virus that causes COVID-19. Institutional protocols and algorithms that pertain to the evaluation of patients  at risk for COVID-19 are in a state of rapid change based on information released by regulatory bodies including the CDC and federal and state organizations. These policies and algorithms were followed during the patient's care in the ED.   As part of my medical decision making, I reviewed the following data within the electronic MEDICAL RECORD NUMBER Nursing notes reviewed and incorporated, Labs reviewed see above, Old chart reviewed, Radiograph reviewed see above, Notes from prior ED visits and [place] Controlled Substance Database  ____________________________________________   FINAL CLINICAL IMPRESSION(S) / ED DIAGNOSES  Final diagnoses:  Gastroenteritis  Suspected COVID-19 virus infection      NEW MEDICATIONS STARTED DURING THIS VISIT:  New Prescriptions   No medications on file     Note:  This document was prepared using Dragon voice recognition software and may include unintentional dictation errors.    Faythe Ghee, PA-C 09/04/19 1204    Sharyn Creamer, MD 09/10/19 1250

## 2019-09-04 NOTE — Discharge Instructions (Signed)
Follow-up with your regular doctor if not improving in 3 days.  Take over-the-counter Imodium A-D to help decrease the amount of diarrhea.  Drink plenty of water as she were a little dehydrated upon arrival.  Return emergency department if you are worsening.  If the bloody stools do not stop after this illness you will need to be seen by GI.  Please call them for an appointment

## 2019-12-16 ENCOUNTER — Other Ambulatory Visit: Payer: Self-pay

## 2019-12-16 ENCOUNTER — Emergency Department

## 2019-12-16 ENCOUNTER — Emergency Department
Admission: EM | Admit: 2019-12-16 | Discharge: 2019-12-16 | Disposition: A | Discharge status: Home / Self Care | Attending: Student | Admitting: Student

## 2019-12-16 DIAGNOSIS — Y929 Unspecified place or not applicable: Secondary | ICD-10-CM | POA: Diagnosis not present

## 2019-12-16 DIAGNOSIS — Z23 Encounter for immunization: Secondary | ICD-10-CM | POA: Insufficient documentation

## 2019-12-16 DIAGNOSIS — Y999 Unspecified external cause status: Secondary | ICD-10-CM | POA: Insufficient documentation

## 2019-12-16 DIAGNOSIS — Y9302 Activity, running: Secondary | ICD-10-CM | POA: Insufficient documentation

## 2019-12-16 DIAGNOSIS — Y9339 Activity, other involving climbing, rappelling and jumping off: Secondary | ICD-10-CM | POA: Insufficient documentation

## 2019-12-16 DIAGNOSIS — W19XXXA Unspecified fall, initial encounter: Secondary | ICD-10-CM

## 2019-12-16 DIAGNOSIS — S52125A Nondisplaced fracture of head of left radius, initial encounter for closed fracture: Secondary | ICD-10-CM | POA: Insufficient documentation

## 2019-12-16 DIAGNOSIS — S61442A Puncture wound with foreign body of left hand, initial encounter: Secondary | ICD-10-CM | POA: Insufficient documentation

## 2019-12-16 DIAGNOSIS — F17210 Nicotine dependence, cigarettes, uncomplicated: Secondary | ICD-10-CM | POA: Insufficient documentation

## 2019-12-16 DIAGNOSIS — S59912A Unspecified injury of left forearm, initial encounter: Secondary | ICD-10-CM | POA: Diagnosis present

## 2019-12-16 MED ORDER — TETANUS-DIPHTH-ACELL PERTUSSIS 5-2.5-18.5 LF-MCG/0.5 IM SUSP
0.5000 mL | Freq: Once | INTRAMUSCULAR | Status: AC
  Administered 2019-12-16: 05:00:00 0.5 mL via INTRAMUSCULAR
  Filled 2019-12-16: qty 0.5

## 2019-12-16 MED ORDER — CEPHALEXIN 500 MG PO CAPS
500.0000 mg | ORAL_CAPSULE | Freq: Two times a day (BID) | ORAL | 0 refills | Status: AC
Start: 2019-12-16 — End: 2019-12-23

## 2019-12-16 MED ORDER — OXYCODONE HCL 5 MG PO TABS
5.0000 mg | ORAL_TABLET | Freq: Once | ORAL | Status: AC
  Administered 2019-12-16: 5 mg via ORAL
  Filled 2019-12-16: qty 1

## 2019-12-16 MED ORDER — OXYCODONE-ACETAMINOPHEN 5-325 MG PO TABS: 1.0000 | ORAL_TABLET | Freq: Four times a day (QID) | ORAL | 0 refills | Status: AC | PRN

## 2019-12-16 NOTE — ED Triage Notes (Signed)
Patient here in custody of [place] [employer] for medical clearance of left arm pain.

## 2019-12-16 NOTE — Discharge Instructions (Addendum)
Thank you for letting us take care of you in the emergency department today. You have a radial head fracture. Please keep in the sling until you are seen by an orthopedic doctor.  Please continue to take any regular, prescribed medications.   New medications we have prescribed:  Pain medication, take as needed for pain you cannot control first with ibuprofen or Tylenol Antibiotic, to prevent infection in your hand  Please follow up with: Orthopedic doctor early next week to follow up on your injury  Please return to the ER for any new or worsening symptoms.

## 2019-12-16 NOTE — ED Notes (Signed)
ED Provider at bedside. 

## 2019-12-16 NOTE — ED Provider Notes (Signed)
[facility] Emergency Department Provider Note  ____________________________________________   First MD Initiated Contact with Patient 12/16/19 0421     (approximate)  I have reviewed the triage vital signs and the nursing notes.  History  Chief Complaint Arm Pain    HPI Erik Patel is a 34 y.o. male no significant medical history who presents to the emergency department with PD for clearance for jail.  Patient complaining of left arm pain, particularly at the elbow and forearm area.  He is not quite sure how he injured it, but states he was "running away" and jumping from balcony's and over things.  He may have landed on it awkwardly.  He denies any pain elsewhere.  Pain is aching, sharp, 10/10 in severity.  Located at the left elbow and left forearm.  No radiation.  Improved with keeping the arm still, worsened with movement.  Feels like he cannot supinate or pronate his hand.  He is right-handed.  No numbness or tingling.   Past Medical Hx Noncontributory  Past Surgical Hx Noncontributory  Medications Prior to Admission medications   Not on File    Allergies Patient has no known allergies.  Family Hx Family History  Problem Relation Age of Onset  . Diabetes Mother     Social Hx Social History   Tobacco Use  . Smoking status: Current Every Day Smoker    Packs/day: 0.50    Types: Cigarettes  . Smokeless tobacco: Never Used  Substance Use Topics  . Alcohol use: No  . Drug use: No     Review of Systems  Constitutional: Negative for fever. Negative for chills. Eyes: Negative for visual changes. ENT: Negative for sore throat. Cardiovascular: Negative for chest pain. Respiratory: Negative for shortness of breath. Gastrointestinal: Negative for nausea. Negative for vomiting.  Genitourinary: Negative for dysuria. Musculoskeletal: + LEFT arm pain Skin: Negative for rash. Neurological: Negative for headaches.   Physical  Exam  Vital Signs: ED Triage Vitals  Enc Vitals Group     BP 12/16/19 0345 122/72     Pulse Rate 12/16/19 0345 83     Resp 12/16/19 0345 16     Temp 12/16/19 0345 98 F (36.7 C)     Temp Source 12/16/19 0345 Oral     SpO2 12/16/19 0345 98 %     Weight 12/16/19 0349 200 lb (90.7 kg)     Height 12/16/19 0349 6\' 3"  (1.905 m)     Head Circumference --      Peak Flow --      Pain Score 12/16/19 0349 10     Pain Loc --      Pain Edu? --      Excl. in [place]? --     Constitutional: Alert and oriented.  Smells of marijuana. Head: Normocephalic. Atraumatic. Eyes: Conjunctivae clear. Sclera anicteric. Pupils equal and symmetric. Nose: No masses or lesions. No congestion or rhinorrhea. Mouth/Throat: Wearing mask.  Neck: No stridor. Trachea midline.  Cardiovascular: Normal rate, regular rhythm. Extremities well perfused.  2+ left radial pulse.  Fingers warm and well-perfused.  Cap refill less than 2 seconds. Respiratory: Normal respiratory effort.  Genitourinary: Deferred. Musculoskeletal: Guarding at the LEFT elbow and forearm. Swelling about the elbow with TTP laterally. Unable to supinate/pronate due to pain/limited ROM. Able to wiggle fingers and flex/extend at the wrist.  Distally, motor/sensation intact in radial, median, ulnar distribution.  2+ radial pulse.  Fingers warm and well-perfused. Neurologic:  Normal speech and language.  No gross focal or lateralizing neurologic deficits are appreciated.  Skin: Skin tear/punctate wound to the left palm. Piece of wood in wound, removed and extensively cleaned as noted below. No discrete lacerations amenable to repair.  Psychiatric: Mood and affect are appropriate for situation.  Radiology  Personally reviewed available imaging myself.   XR LEFT elbow - IMPRESSION:  1. Acute nondisplaced fracture of the left radial head/neck.  2. Associated joint effusion.   XR LEFT forearm - IMPRESSION:  1. Acute nondisplaced fracture of the left radial  head/neck, better  seen on concomitant elbow radiograph.  2. No other acute osseous abnormality about the left forearm.   XR LEFT hand - IMPRESSION:  No acute osseous abnormality about the left hand.    Procedures  Procedure(s) performed (including critical care):  .Foreign Body Removal  Date/Time: 12/16/2019 6:30 AM Performed by: Lilia Pro., MD Authorized by: Lilia Pro., MD  Consent: Verbal consent obtained. Consent given by: patient Imaging studies: imaging studies available Body area: skin General location: upper extremity Location details: left hand Localization method: visualized Removal mechanism: forceps Depth: subcutaneous 2 objects recovered. Objects recovered: wood pieces Post-procedure assessment: foreign body removed Patient tolerance: patient tolerated the procedure well with no immediate complications Comments: Left palm puncture type wound first soaked in warm soap and water. After this, thorough evaluation through full ROM in bloodless field was performed.  Two small pieces of wood were removed. Area was then extensively cleaned again with localized irrigation and betadine. Inspected again after irrigation, no further foreign bodies identified. No closure indicated based on nature of wound. Discussed the possibility that microscopic fragments could still be present despite our thorough inspection and cleaning and the need for close observation and good wound care + adherence to preventative antibiotics. He voices understanding of this.      Initial Impression / Assessment and Plan / MDM / ED Course  34 y.o. male who presents to the ED for left arm pain 2/2 vague injury. Presents in PD custody for clearance from jail.  Ddx: fracture, contusion, dislocation  Will plan for imaging, pain control, update Tdap  Imaging reveals a nondisplaced radial head fracture w/ associated joint swelling, which is consistent with his exam. No other acute injries. Will  plan for sling and outpatient follow up with ortho. Hand would cleaned as noted above. Will provide course of Keflex and discussed with patient strict return precautions. He voices understanding and is comfortable with plan. Stable for discharge.    _______________________________   As part of my medical decision making I have reviewed available labs, radiology tests, reviewed old records/performed chart review.   Final Clinical Impression(s) / ED Diagnosis  Final diagnoses:  Fall, initial encounter  Closed nondisplaced fracture of head of left radius, initial encounter       Note:  This document was prepared using Dragon voice recognition software and may include unintentional dictation errors.   Lilia Pro., MD 12/16/19 1016

## 2020-11-23 IMAGING — DX DG HAND COMPLETE 3+V*L*
3 series · 3 of 3 positions shown · non-contrast
Comparison: None.

CLINICAL DATA: Initial evaluation for acute trauma, fall.

EXAM:
LEFT HAND - COMPLETE 3+ VIEW

[hand ap]
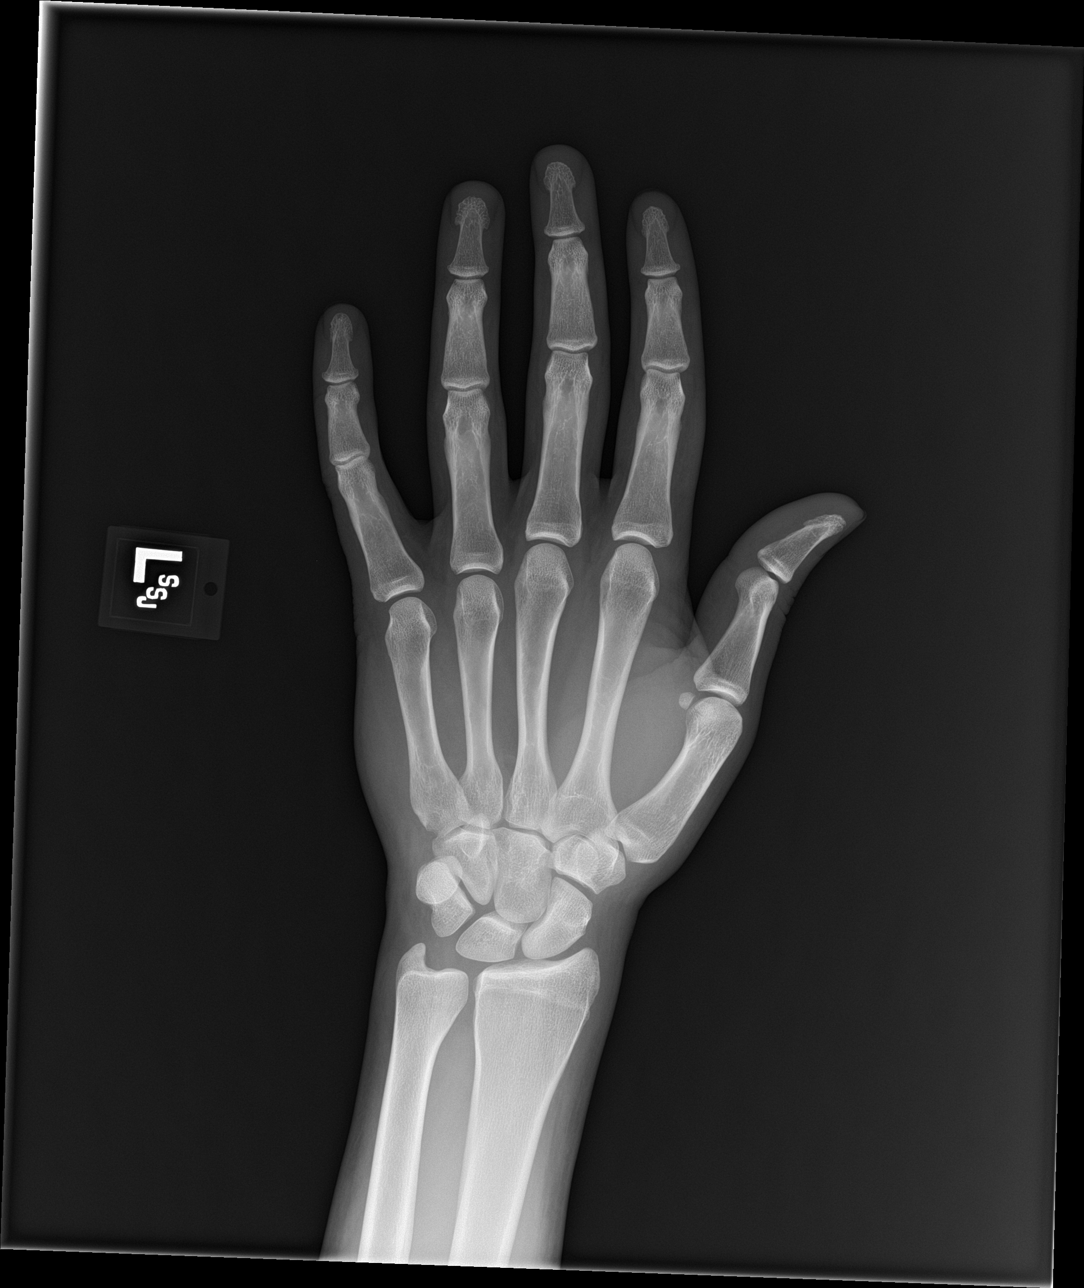

[hand obl]
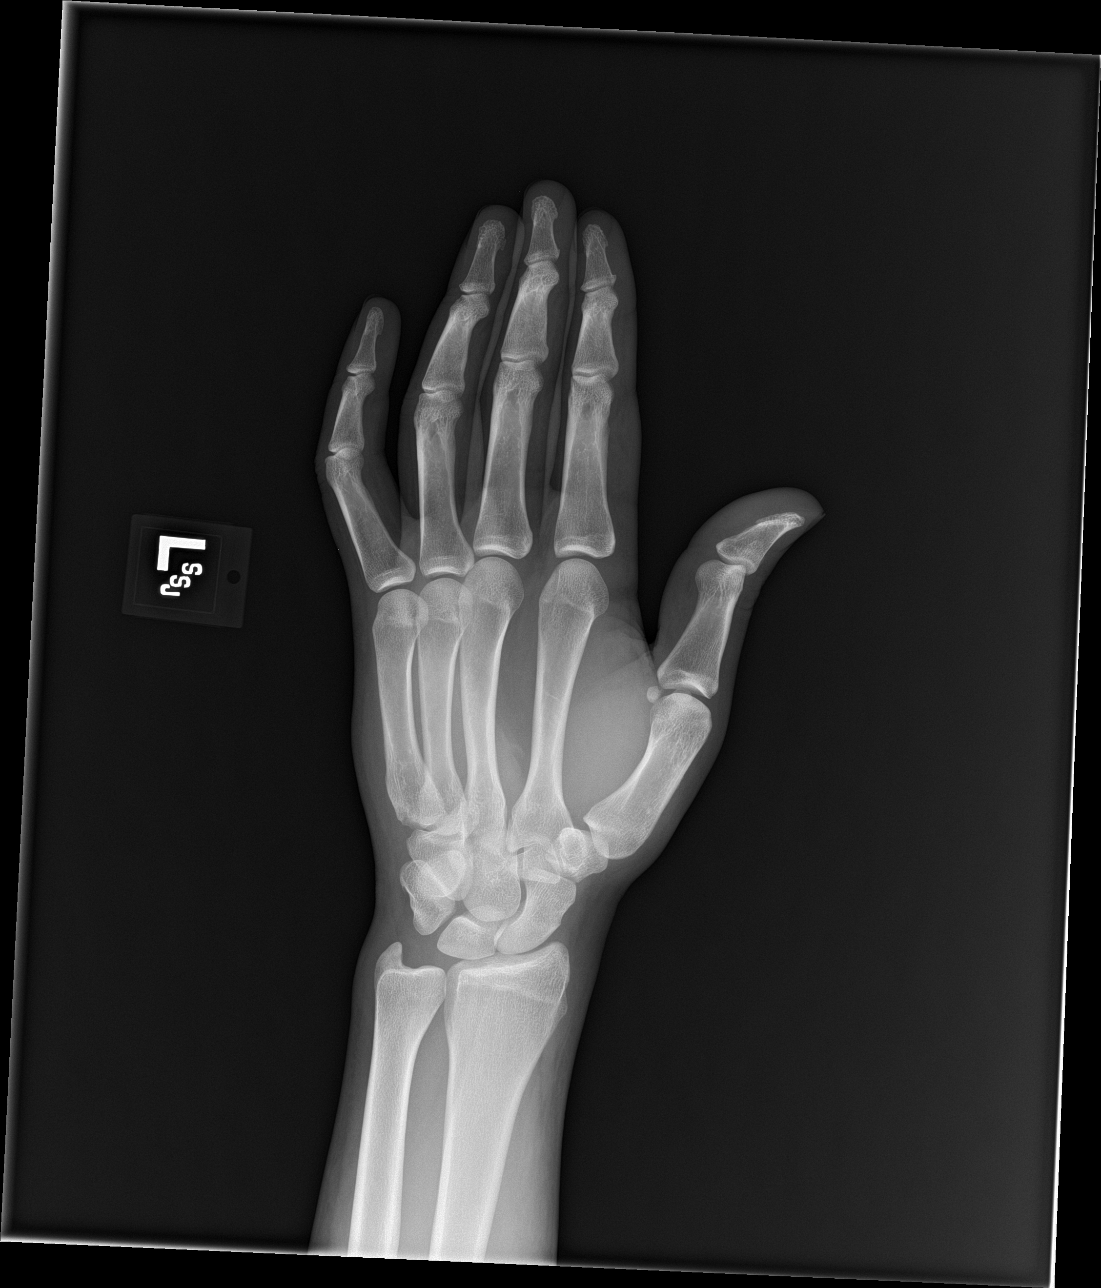

[hand lat]
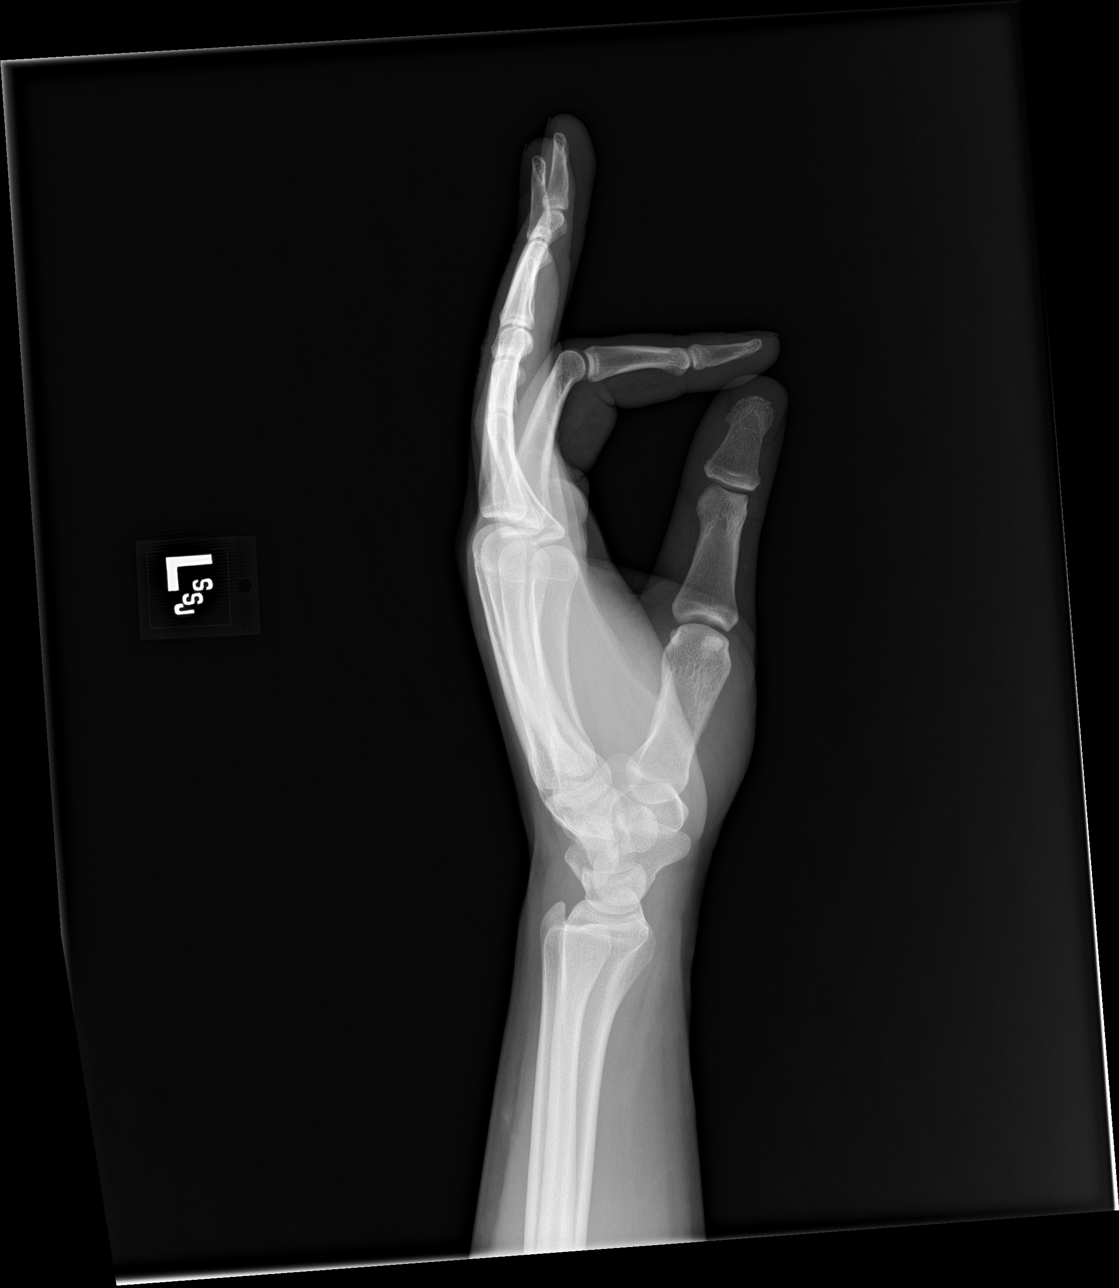

[3 of 3 positions shown; findings below may reference images not displayed]

FINDINGS: There is no evidence of fracture or dislocation. There is no
evidence of arthropathy or other focal bone abnormality. Soft
tissues are unremarkable.
IMPRESSION: No acute osseous abnormality about the left hand.

## 2024-05-16 ENCOUNTER — Other Ambulatory Visit: Payer: Self-pay | Admitting: Medical Genetics

## 2024-07-14 ENCOUNTER — Emergency Department: Payer: Self-pay

## 2024-07-14 ENCOUNTER — Emergency Department
Admission: EM | Admit: 2024-07-14 | Discharge: 2024-07-14 | Disposition: A | Discharge status: Home / Self Care | Payer: Self-pay | Attending: Emergency Medicine | Admitting: Emergency Medicine

## 2024-07-14 ENCOUNTER — Encounter: Payer: Self-pay | Admitting: Emergency Medicine

## 2024-07-14 ENCOUNTER — Other Ambulatory Visit: Payer: Self-pay

## 2024-07-14 DIAGNOSIS — S62524A Nondisplaced fracture of distal phalanx of right thumb, initial encounter for closed fracture: Secondary | ICD-10-CM | POA: Insufficient documentation

## 2024-07-14 DIAGNOSIS — Y990 Civilian activity done for income or pay: Secondary | ICD-10-CM | POA: Insufficient documentation

## 2024-07-14 DIAGNOSIS — W208XXA Other cause of strike by thrown, projected or falling object, initial encounter: Secondary | ICD-10-CM | POA: Insufficient documentation

## 2024-07-14 DIAGNOSIS — M7989 Other specified soft tissue disorders: Secondary | ICD-10-CM | POA: Insufficient documentation

## 2024-07-14 MED ORDER — NAPROXEN 500 MG PO TABS: 500.0000 mg | ORAL_TABLET | Freq: Two times a day (BID) | ORAL | 0 refills | Status: AC

## 2024-07-14 NOTE — Discharge Instructions (Signed)
 You have broken the bone of the tip of the thumb in multiple places.  Please keep your hand in the splint and follow-up with the hand surgeon.  Please call today to make an appointment for next week.  Please return for any new, worsening, or changing symptoms or other concerns.  It was a pleasure caring for you today.

## 2024-07-14 NOTE — ED Triage Notes (Signed)
 Crush injury to right thumb yesterday at work. Swelling appreciated to thumb.

## 2024-07-14 NOTE — ED Provider Notes (Signed)
 [facility] Provider Note    Event Date/Time   First MD Initiated Contact with Patient 07/14/24 0813     (approximate)   History   Hand Injury   HPI  Erik Patel is a 38 y.o. male right-hand-dominant who presents today for evaluation of thumb injury that occurred yesterday.  Patient reports that he was at work and a machine piece fell onto his thumb.  He reports that he was able to remove his thumb immediately and his thumb was not caught underneath it for prolonged period of time.  He did not sustain any open wounds.  He noticed swelling right away, which continued today prompting him to come in for evaluation.  No numbness or tingling.  He has noticed bruising and swelling to his thumb.  He is still able to flex and extend his thumb normally.  No other injury sustained.  There are no active problems to display for this patient.         Physical Exam   Triage Vital Signs: ED Triage Vitals  Encounter Vitals Group     BP 07/14/24 0805 113/84     Girls Systolic BP Percentile --      Girls Diastolic BP Percentile --      Boys Systolic BP Percentile --      Boys Diastolic BP Percentile --      Pulse Rate 07/14/24 0805 70     Resp 07/14/24 0805 16     Temp 07/14/24 0805 (!) 97.5 F (36.4 C)     Temp Source 07/14/24 0805 Oral     SpO2 07/14/24 0805 100 %     Weight 07/14/24 0806 199 lb 15.3 oz (90.7 kg)     Height --      Head Circumference --      Peak Flow --      Pain Score 07/14/24 0806 10     Pain Loc --      Pain Education --      Exclude from Growth Chart --     Most recent vital signs: Vitals:   07/14/24 0805 07/14/24 0824  BP: 113/84   Pulse: 70   Resp: 16   Temp: (!) 97.5 F (36.4 C)   SpO2: 100% 100%    Physical Exam Vitals and nursing note reviewed.  Constitutional:      General: Awake and alert. No acute distress.    Appearance: Normal appearance. The patient is normal weight.  HENT:     Head: Normocephalic and  atraumatic.     Mouth: Mucous membranes are moist.  Eyes:     General: PERRL. Normal EOMs        Right eye: No discharge.        Left eye: No discharge.     Conjunctiva/sclera: Conjunctivae normal.  Cardiovascular:     Rate and Rhythm: Normal rate     Pulses: Normal pulses.  Pulmonary:     Effort: Pulmonary effort is normal.   Abdominal:     Abdomen is soft. There is no abdominal tenderness. No rebound or guarding. No distention. Musculoskeletal:        General: No swelling. Normal range of motion.     Cervical back: Normal range of motion and neck supple.  Right hand: Diffuse swelling to his left thumb with ecchymosis to the pad of his thumb and to his DIP.  He is able to flex and extend at isolated DIP and MCP against resistance.  Able to abduct and adduct thumb fully against resistance.  Sensation intact light touch throughout.  No open wounds noted.  No snuffbox tenderness.  No tenderness throughout the rest of his hand or wrist. Skin:    General: Skin is warm and dry.     Capillary Refill: Capillary refill takes less than 2 seconds.     Findings: No rash.  Neurological:     Mental Status: The patient is awake and alert.      ED Results / Procedures / Treatments   Labs (all labs ordered are listed, but only abnormal results are displayed) Labs Reviewed - No data to display   EKG     RADIOLOGY I independently reviewed and interpreted imaging and agree with radiologists findings.     PROCEDURES:  Critical Care performed:   Procedures   MEDICATIONS ORDERED IN ED: Medications - No data to display   IMPRESSION / MDM / ASSESSMENT AND PLAN / ED COURSE  I reviewed the triage vital signs and the nursing notes.   Differential diagnosis includes, but is not limited to, fracture, dislocation, contusion, ligamental injury.  Patient is awake and alert, hemodynamically stable and afebrile.  He is nontoxic in appearance.  Patient is awake and alert, hemodynamically  stable and afebrile.  He is able to flex and extend at isolated DIP, PIP, and MCP against resistance, I do not suspect tendon injury.  He does have diffuse swelling throughout his thumb with ecchymosis to the pad and over the DIP.  Sensation is intact to light touch throughout.  No subungual hematoma.  No open wounds.  X-ray obtained reveals a comminuted distal tuft fracture.  Patient was placed in a splint and instructed to follow-up with hand surgery.  The appropriate follow-up information was provided.  We discussed rest, ice, elevation.  He was given a work note.  We discussed return precautions and outpatient follow-up.  Patient understands and agrees with plan.  Discharged in stable condition.   Patient's presentation is most consistent with acute complicated illness / injury requiring diagnostic workup.     FINAL CLINICAL IMPRESSION(S) / ED DIAGNOSES   Final diagnoses:  Closed nondisplaced fracture of distal phalanx of right thumb, initial encounter     Rx / DC Orders   ED Discharge Orders          Ordered    naproxen  (NAPROSYN ) 500 MG tablet  2 times daily with meals        07/14/24 0917             Note:  This document was prepared using Dragon voice recognition software and may include unintentional dictation errors.   Lillar Bianca E, PA-C 07/14/24 1055    Dorothyann Drivers, MD 07/14/24 [phone]
